# Patient Record
Sex: Female | Born: 1935 | Race: White | Hispanic: No | Marital: Married
Health system: Southern US, Community
[De-identification: ages and names within clinical notes are randomized; demographics above are authoritative.]

---

## 1998-02-26 ENCOUNTER — Other Ambulatory Visit: Admission: RE | Admit: 1998-02-26 | Discharge: 1998-02-26 | Payer: Self-pay | Admitting: Nephrology

## 1998-03-25 ENCOUNTER — Other Ambulatory Visit: Admission: RE | Admit: 1998-03-25 | Discharge: 1998-03-25 | Payer: Self-pay | Admitting: Nephrology

## 1998-03-30 ENCOUNTER — Other Ambulatory Visit: Admission: RE | Admit: 1998-03-30 | Discharge: 1998-03-30 | Payer: Self-pay | Admitting: *Deleted

## 1998-06-10 ENCOUNTER — Encounter: Payer: Self-pay | Admitting: Emergency Medicine

## 1998-06-10 ENCOUNTER — Encounter: Payer: Self-pay | Admitting: Nephrology

## 1998-06-10 ENCOUNTER — Inpatient Hospital Stay (HOSPITAL_COMMUNITY): Admission: EM | Admit: 1998-06-10 | Discharge: 1998-06-17 | Payer: Self-pay | Admitting: *Deleted

## 1998-06-15 ENCOUNTER — Encounter: Payer: Self-pay | Admitting: Nephrology

## 1998-07-03 ENCOUNTER — Ambulatory Visit (HOSPITAL_COMMUNITY): Admission: RE | Admit: 1998-07-03 | Discharge: 1998-07-03 | Payer: Self-pay | Admitting: Cardiology

## 1998-07-03 ENCOUNTER — Encounter: Payer: Self-pay | Admitting: Cardiology

## 2000-04-04 ENCOUNTER — Encounter (HOSPITAL_COMMUNITY): Admission: RE | Admit: 2000-04-04 | Discharge: 2000-07-03 | Payer: Self-pay | Admitting: Nephrology

## 2000-10-18 ENCOUNTER — Encounter: Payer: Self-pay | Admitting: Nephrology

## 2000-10-18 ENCOUNTER — Ambulatory Visit (HOSPITAL_COMMUNITY): Admission: RE | Admit: 2000-10-18 | Discharge: 2000-10-18 | Payer: Self-pay | Admitting: Nephrology

## 2000-10-25 ENCOUNTER — Encounter (HOSPITAL_COMMUNITY): Admission: RE | Admit: 2000-10-25 | Discharge: 2001-01-23 | Payer: Self-pay | Admitting: Nephrology

## 2001-01-24 ENCOUNTER — Encounter (HOSPITAL_COMMUNITY): Admission: RE | Admit: 2001-01-24 | Discharge: 2001-04-24 | Payer: Self-pay | Admitting: Nephrology

## 2001-05-02 ENCOUNTER — Encounter (HOSPITAL_COMMUNITY): Admission: RE | Admit: 2001-05-02 | Discharge: 2001-07-31 | Payer: Self-pay | Admitting: Nephrology

## 2001-08-08 ENCOUNTER — Encounter (HOSPITAL_COMMUNITY): Admission: RE | Admit: 2001-08-08 | Discharge: 2001-11-06 | Payer: Self-pay | Admitting: Nephrology

## 2001-11-14 ENCOUNTER — Encounter (HOSPITAL_COMMUNITY): Admission: RE | Admit: 2001-11-14 | Discharge: 2002-02-12 | Payer: Self-pay | Admitting: Nephrology

## 2002-05-15 ENCOUNTER — Encounter (HOSPITAL_COMMUNITY): Admission: RE | Admit: 2002-05-15 | Discharge: 2002-08-13 | Payer: Self-pay | Admitting: Nephrology

## 2002-08-20 ENCOUNTER — Encounter (HOSPITAL_COMMUNITY): Admission: RE | Admit: 2002-08-20 | Discharge: 2002-11-18 | Payer: Self-pay | Admitting: Nephrology

## 2002-12-11 ENCOUNTER — Encounter (HOSPITAL_COMMUNITY): Admission: RE | Admit: 2002-12-11 | Discharge: 2003-03-11 | Payer: Self-pay | Admitting: Nephrology

## 2003-04-02 ENCOUNTER — Encounter (HOSPITAL_COMMUNITY): Admission: RE | Admit: 2003-04-02 | Discharge: 2003-07-01 | Payer: Self-pay | Admitting: Nephrology

## 2003-07-16 ENCOUNTER — Encounter (HOSPITAL_COMMUNITY): Admission: RE | Admit: 2003-07-16 | Discharge: 2003-10-14 | Payer: Self-pay | Admitting: Nephrology

## 2003-10-15 ENCOUNTER — Encounter (HOSPITAL_COMMUNITY): Admission: RE | Admit: 2003-10-15 | Discharge: 2004-01-13 | Payer: Self-pay | Admitting: Nephrology

## 2004-01-31 ENCOUNTER — Emergency Department (HOSPITAL_COMMUNITY): Admission: EM | Admit: 2004-01-31 | Discharge: 2004-01-31 | Payer: Self-pay | Admitting: Emergency Medicine

## 2004-02-03 ENCOUNTER — Encounter (HOSPITAL_COMMUNITY): Admission: RE | Admit: 2004-02-03 | Discharge: 2004-05-03 | Payer: Self-pay | Admitting: Nephrology

## 2004-05-26 ENCOUNTER — Encounter (HOSPITAL_COMMUNITY): Admission: RE | Admit: 2004-05-26 | Discharge: 2004-08-24 | Payer: Self-pay | Admitting: Nephrology

## 2004-08-25 ENCOUNTER — Encounter (HOSPITAL_COMMUNITY): Admission: RE | Admit: 2004-08-25 | Discharge: 2004-11-23 | Payer: Self-pay | Admitting: Nephrology

## 2004-11-24 ENCOUNTER — Encounter (HOSPITAL_COMMUNITY): Admission: RE | Admit: 2004-11-24 | Discharge: 2005-02-22 | Payer: Self-pay | Admitting: Nephrology

## 2005-03-17 ENCOUNTER — Encounter (HOSPITAL_COMMUNITY): Admission: RE | Admit: 2005-03-17 | Discharge: 2005-06-15 | Payer: Self-pay | Admitting: Nephrology

## 2005-07-06 ENCOUNTER — Encounter (HOSPITAL_COMMUNITY): Admission: RE | Admit: 2005-07-06 | Discharge: 2005-10-04 | Payer: Self-pay | Admitting: Nephrology

## 2005-10-26 ENCOUNTER — Encounter (HOSPITAL_COMMUNITY): Admission: RE | Admit: 2005-10-26 | Discharge: 2006-01-24 | Payer: Self-pay | Admitting: Nephrology

## 2006-01-25 ENCOUNTER — Encounter (HOSPITAL_COMMUNITY): Admission: RE | Admit: 2006-01-25 | Discharge: 2006-04-25 | Payer: Self-pay | Admitting: Critical Care Medicine

## 2006-07-03 ENCOUNTER — Ambulatory Visit: Payer: Self-pay

## 2006-07-05 ENCOUNTER — Encounter: Admission: RE | Admit: 2006-07-05 | Discharge: 2006-07-05 | Payer: Self-pay | Admitting: Nephrology

## 2006-10-11 ENCOUNTER — Encounter (HOSPITAL_COMMUNITY): Admission: RE | Admit: 2006-10-11 | Discharge: 2007-01-09 | Payer: Self-pay | Admitting: Nephrology

## 2007-01-29 ENCOUNTER — Encounter (HOSPITAL_COMMUNITY): Admission: RE | Admit: 2007-01-29 | Discharge: 2007-04-29 | Payer: Self-pay | Admitting: Nephrology

## 2007-05-23 ENCOUNTER — Encounter (HOSPITAL_COMMUNITY): Admission: RE | Admit: 2007-05-23 | Discharge: 2007-08-21 | Payer: Self-pay | Admitting: Nephrology

## 2007-07-03 ENCOUNTER — Ambulatory Visit: Payer: Self-pay

## 2007-08-22 ENCOUNTER — Encounter (HOSPITAL_COMMUNITY): Admission: RE | Admit: 2007-08-22 | Discharge: 2007-11-20 | Payer: Self-pay | Admitting: Nephrology

## 2007-12-17 ENCOUNTER — Encounter (HOSPITAL_COMMUNITY): Admission: RE | Admit: 2007-12-17 | Discharge: 2008-03-16 | Payer: Self-pay | Admitting: Nephrology

## 2008-03-19 ENCOUNTER — Encounter (HOSPITAL_COMMUNITY): Admission: RE | Admit: 2008-03-19 | Discharge: 2008-06-17 | Payer: Self-pay | Admitting: Nephrology

## 2008-07-16 ENCOUNTER — Encounter (HOSPITAL_COMMUNITY): Admission: RE | Admit: 2008-07-16 | Discharge: 2008-10-09 | Payer: Self-pay | Admitting: Nephrology

## 2008-10-28 ENCOUNTER — Ambulatory Visit: Payer: Self-pay | Admitting: Vascular Surgery

## 2008-11-05 ENCOUNTER — Encounter (HOSPITAL_COMMUNITY): Admission: RE | Admit: 2008-11-05 | Discharge: 2009-01-28 | Payer: Self-pay | Admitting: Nephrology

## 2009-03-04 ENCOUNTER — Encounter (HOSPITAL_COMMUNITY): Admission: RE | Admit: 2009-03-04 | Discharge: 2009-06-02 | Payer: Self-pay | Admitting: Nephrology

## 2009-06-24 ENCOUNTER — Encounter (HOSPITAL_COMMUNITY): Admission: RE | Admit: 2009-06-24 | Discharge: 2009-09-22 | Payer: Self-pay | Admitting: Nephrology

## 2009-09-23 ENCOUNTER — Encounter (HOSPITAL_COMMUNITY): Admission: RE | Admit: 2009-09-23 | Discharge: 2009-12-22 | Payer: Self-pay | Admitting: Nephrology

## 2010-01-06 ENCOUNTER — Encounter (HOSPITAL_COMMUNITY): Admission: RE | Admit: 2010-01-06 | Discharge: 2010-04-06 | Payer: Self-pay | Admitting: Nephrology

## 2010-04-15 ENCOUNTER — Encounter (HOSPITAL_COMMUNITY): Admission: RE | Admit: 2010-04-15 | Discharge: 2010-07-14 | Payer: Self-pay | Admitting: Nephrology

## 2010-04-28 ENCOUNTER — Observation Stay (HOSPITAL_COMMUNITY): Admission: RE | Admit: 2010-04-28 | Discharge: 2010-04-29 | Payer: Self-pay | Admitting: Cardiology

## 2010-05-07 ENCOUNTER — Encounter: Admission: RE | Admit: 2010-05-07 | Discharge: 2010-05-07 | Payer: Self-pay | Admitting: Cardiology

## 2010-06-02 ENCOUNTER — Emergency Department (HOSPITAL_COMMUNITY): Admission: EM | Admit: 2010-06-02 | Discharge: 2010-06-02 | Payer: Self-pay | Admitting: Emergency Medicine

## 2010-06-09 ENCOUNTER — Ambulatory Visit: Payer: Self-pay | Admitting: Cardiology

## 2010-06-27 ENCOUNTER — Inpatient Hospital Stay (HOSPITAL_COMMUNITY): Admission: EM | Admit: 2010-06-27 | Discharge: 2010-07-02 | Payer: Self-pay | Admitting: Emergency Medicine

## 2010-06-27 ENCOUNTER — Ambulatory Visit: Payer: Self-pay | Admitting: Cardiovascular Disease

## 2010-06-28 ENCOUNTER — Ambulatory Visit: Payer: Self-pay | Admitting: Surgery

## 2010-06-28 ENCOUNTER — Encounter: Payer: Self-pay | Admitting: Cardiovascular Disease

## 2010-07-14 ENCOUNTER — Encounter (HOSPITAL_COMMUNITY)
Admission: RE | Admit: 2010-07-14 | Discharge: 2010-10-12 | Payer: Self-pay | Source: Home / Self Care | Attending: Nephrology | Admitting: Nephrology

## 2010-07-26 ENCOUNTER — Ambulatory Visit: Payer: Self-pay | Admitting: Cardiology

## 2010-08-04 ENCOUNTER — Ambulatory Visit: Payer: Self-pay | Admitting: Cardiology

## 2010-08-07 ENCOUNTER — Encounter: Payer: Self-pay | Admitting: Internal Medicine

## 2010-08-20 ENCOUNTER — Ambulatory Visit: Payer: Self-pay | Admitting: Cardiology

## 2010-08-24 ENCOUNTER — Encounter (INDEPENDENT_AMBULATORY_CARE_PROVIDER_SITE_OTHER): Payer: Self-pay | Admitting: *Deleted

## 2010-09-01 ENCOUNTER — Ambulatory Visit: Payer: Self-pay | Admitting: Cardiovascular Disease

## 2010-09-01 ENCOUNTER — Inpatient Hospital Stay (HOSPITAL_COMMUNITY)
Admission: EM | Admit: 2010-09-01 | Discharge: 2010-09-13 | Disposition: A | Discharge status: Hospice | Payer: Self-pay | Source: Home / Self Care | Attending: Internal Medicine | Admitting: Internal Medicine

## 2010-09-07 ENCOUNTER — Ambulatory Visit: Payer: Self-pay | Admitting: Internal Medicine

## 2010-09-21 ENCOUNTER — Ambulatory Visit: Payer: Self-pay | Admitting: Internal Medicine

## 2010-09-22 ENCOUNTER — Encounter: Payer: Self-pay | Admitting: Internal Medicine

## 2010-10-10 DEATH — deceased

## 2010-10-14 IMAGING — CR DG CHEST 2V
2 series · 2 of 2 positions shown · non-contrast
Comparison: 1 day prior

CLINICAL DATA: Status post pacer placement.  Chest pain.

CHEST - 2 VIEW

[w chest pa]
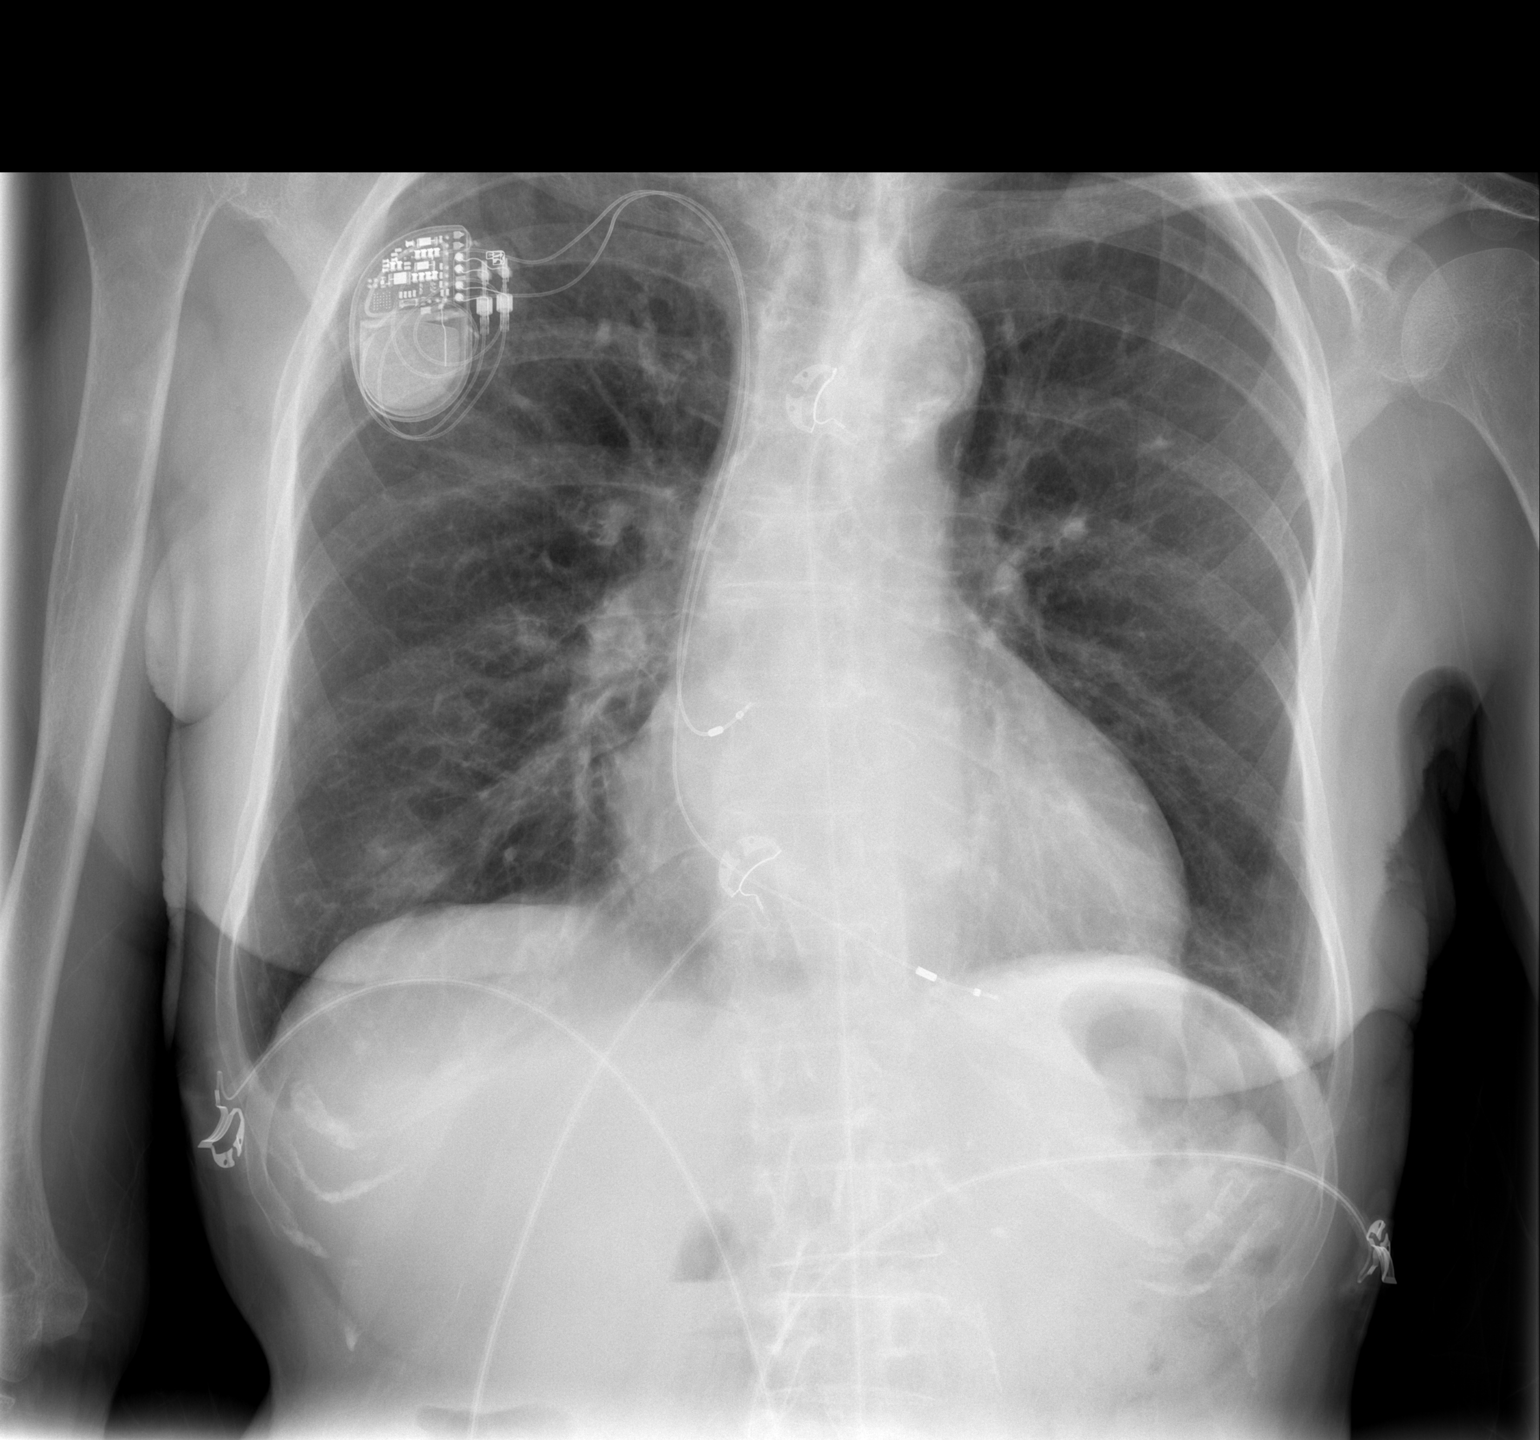

[w chest lat]
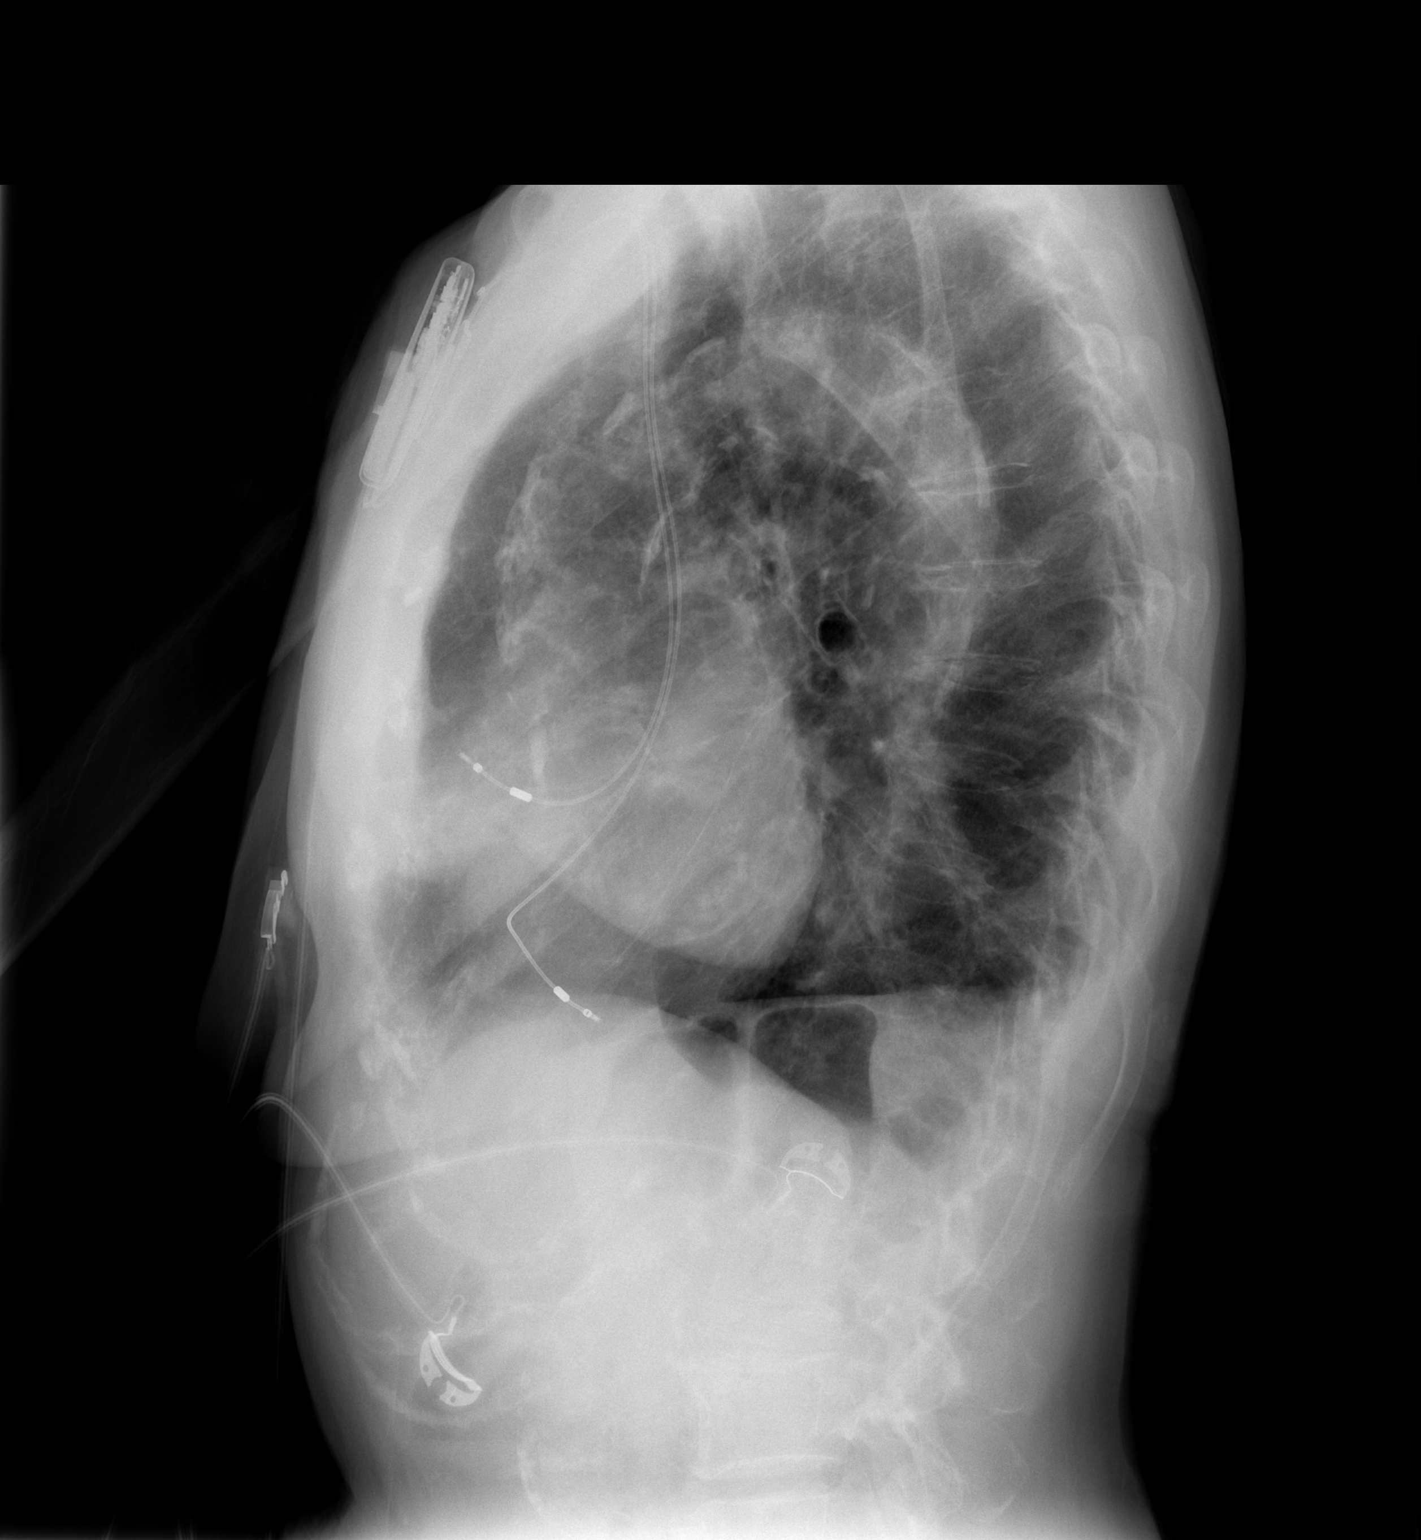

[2 of 2 positions shown; findings below may reference images not displayed]

FINDINGS: Hyperinflation suggests COPD.

Mild osteopenia.  Interval placement of a dual lead pacer.  Leads
terminate at the right atrium and right ventricle.

Midline trachea.  Normal heart size and mediastinal contours for
age.  No pleural effusion or pneumothorax.  Vague increased density
projecting over the right lower lobe on the frontal view. No
congestive failure.
IMPRESSION: 1.  Status post pacer placement without pneumothorax.
2.  COPD without acute superimposed process.
3.  Vague increased density projecting over the right lung base on
the frontal film.  Favored to represent a summation shadow,
possibly with overlying scarring.  Not readily apparent on
yesterday's plain film.  Consider radiographic follow-up.  If there
are infectious symptoms, early infection cannot be excluded.

## 2010-11-09 NOTE — Miscellaneous (Signed)
Summary: Device preload  Clinical Lists Changes  Observations: Added new observation of PPM INDICATN: A-fib Sick sinus syndrome (08/07/2010 11:48) Added new observation of MAGNET RTE: BOL 85 ERI 65 (08/07/2010 11:48) Added new observation of PPMLEADSTAT2: active (08/07/2010 11:48) Added new observation of PPMLEADSER2: 045409  (08/07/2010 11:48) Added new observation of PPMLEADMOD2: 4470  (08/07/2010 11:48) Added new observation of PPMLEADDOI2: 04/29/2010  (08/07/2010 11:48) Added new observation of PPMLEADLOC2: RV  (08/07/2010 11:48) Added new observation of PPMLEADSTAT1: active  (08/07/2010 11:48) Added new observation of PPMLEADSER1: 811914  (08/07/2010 11:48) Added new observation of PPMLEADMOD1: 4469  (08/07/2010 11:48) Added new observation of PPMLEADDOI1: 04/29/2010  (08/07/2010 11:48) Added new observation of PPMLEADLOC1: RA  (08/07/2010 11:48) Added new observation of PPM DOI: 04/29/2010  (08/07/2010 11:48) Added new observation of PPM SERL#: [id number]  (08/07/2010 11:48) Added new observation of PPM MODL#: [id number]  (08/07/2010 08:65) Added new observation of PACEMAKERMFG: [employer]  (08/07/2010 11:48) Added new observation of PPM IMP MD: Duffy Rhody Tennant,MD  (08/07/2010 11:48) Added new observation of PPM REFER MD: Rolla Plate  (08/07/2010 11:48) Added new observation of PACEMAKER MD: Hillis Range, MD  (08/07/2010 11:48)      PPM Specifications Following MD:  Hillis Range, MD     Referring MD:  Rolla Plate PPM Vendor:  [employer]tronic     PPM Model Number:  [id number]     PPM Serial Number:  [id number] PPM DOI:  04/29/2010     PPM Implanting MD:  Rolla Plate  Lead 1    Location: RA     DOI: 04/29/2010     Model #: [id number]     Serial #: [id number]     Status: active Lead 2    Location: RV     DOI: 04/29/2010     Model #: [id number]     Serial #: [id number]     Status: active  Magnet Response Rate:  BOL 85 ERI 65  Indications:  A-fib Sick sinus syndrome

## 2010-11-09 NOTE — Letter (Signed)
Summary: New Patient letter  [facility]  [address] [place], [place] [address]   Phone: [phone]  Fax: [phone]       08/24/2010 MRN: [id number]  Judith Leach [address], [place]  [address]  Dear Ms. Judith Leach,  Welcome to the Gastroenterology Division at [facility].    You are scheduled to see Dr. Leone Payor on 10-08-10 at 2:00p.m. on the 3rd floor at [facility], [address].  We ask that you try to arrive at our office 15 minutes prior to your appointment time to allow for check-in.  We would like you to complete the enclosed self-administered evaluation form prior to your visit and bring it with you on the day of your appointment.  We will review it with you.  Also, please bring a complete list of all your medications or, if you prefer, bring the medication bottles and we will list them.  Please bring your insurance card so that we may make a copy of it.  If your insurance requires a referral to see a specialist, please bring your referral form from your primary care physician.  Co-payments are due at the time of your visit and may be paid by cash, check or credit card.     Your office visit will consist of a consult with your physician (includes a physical exam), any laboratory testing he/she may order, scheduling of any necessary diagnostic testing (e.g. x-ray, ultrasound, CT-scan), and scheduling of a procedure (e.g. Endoscopy, Colonoscopy) if required.  Please allow enough time on your schedule to allow for any/all of these possibilities.    If you cannot keep your appointment, please call [phone] to cancel or reschedule prior to your appointment date.  This allows [place] the opportunity to schedule an appointment for another patient in need of care.  If you do not cancel or reschedule by 5 p.m. the business day prior to your appointment date, you will be charged a $50.00 late cancellation/no-show fee.    Thank you for choosing [facility] for your medical needs.  We appreciate the opportunity to care for you.  Please visit [place] at our website  to learn more about our practice.                     Sincerely,                                                             The Gastroenterology Division

## 2010-11-11 NOTE — Letter (Signed)
Summary: Appointment -missed  [facility] [address]   [place], [place] [address]   Phone: [phone]  Fax: [phone]     September 21, 2010 MRN: [id number]     Judith Leach [address] [facility] [address], [place]  [address]     Dear Judith Leach,  Our records indicate you missed your appointment on September 21, 2010                        with Dr.  Johney Frame.   It is very important that we reach you to reschedule this appointment. We look forward to participating in your health care needs.   Please contact us at the number listed above at your earliest convenience to reschedule this appointment.   Sincerely,    Glass blower/designer

## 2010-12-21 LAB — CBC
HCT: 37.4 % (ref 36.0–46.0)
HCT: 38.9 % (ref 36.0–46.0)
HCT: 38.2 % (ref 36.0–46.0)
HCT: 38.7 % (ref 36.0–46.0)
HCT: 39.8 % (ref 36.0–46.0)
HCT: 39.8 % (ref 36.0–46.0)
HCT: 40.1 % (ref 36.0–46.0)
HCT: 40.3 % (ref 36.0–46.0)
HCT: 41.8 % (ref 36.0–46.0)
HCT: 43.3 % (ref 36.0–46.0)
Hemoglobin: 12.2 g/dL (ref 12.0–15.0)
Hemoglobin: 12.5 g/dL (ref 12.0–15.0)
Hemoglobin: 13.1 g/dL (ref 12.0–15.0)
Hemoglobin: 13.3 g/dL (ref 12.0–15.0)
Hemoglobin: 13.3 g/dL (ref 12.0–15.0)
Hemoglobin: 13.8 g/dL (ref 12.0–15.0)
MCH: 31.9 pg (ref 26.0–34.0)
MCH: 32.1 pg (ref 26.0–34.0)
MCH: 32.8 pg (ref 26.0–34.0)
MCHC: 32.9 g/dL (ref 30.0–36.0)
MCHC: 32.3 g/dL (ref 30.0–36.0)
MCHC: 32.8 g/dL (ref 30.0–36.0)
MCHC: 33 g/dL (ref 30.0–36.0)
MCV: 97 fL (ref 78.0–100.0)
MCV: 98.7 fL (ref 78.0–100.0)
MCV: 99.5 fL (ref 78.0–100.0)
MCV: 99.5 fL (ref 78.0–100.0)
MCV: 97.9 fL (ref 78.0–100.0)
MCV: 98.7 fL (ref 78.0–100.0)
MCV: 99.4 fL (ref 78.0–100.0)
MCV: 99.7 fL (ref 78.0–100.0)
MCV: 99.8 fL (ref 78.0–100.0)
MCV: 99.8 fL (ref 78.0–100.0)
MCV: 99.8 fL (ref 78.0–100.0)
MCV: 99.9 fL (ref 78.0–100.0)
Platelets: 105 10*3/uL — ABNORMAL LOW (ref 150–400)
Platelets: 108 10*3/uL — ABNORMAL LOW (ref 150–400)
Platelets: 162 10*3/uL (ref 150–400)
Platelets: 139 10*3/uL — ABNORMAL LOW (ref 150–400)
Platelets: 213 10*3/uL (ref 150–400)
RBC: 3.67 MIL/uL — ABNORMAL LOW (ref 3.87–5.11)
RBC: 3.79 MIL/uL — ABNORMAL LOW (ref 3.87–5.11)
RBC: 3.9 MIL/uL (ref 3.87–5.11)
RBC: 4.01 MIL/uL (ref 3.87–5.11)
RBC: 4.04 MIL/uL (ref 3.87–5.11)
RBC: 4.1 MIL/uL (ref 3.87–5.11)
RBC: 4.34 MIL/uL (ref 3.87–5.11)
RDW: 17.5 % — ABNORMAL HIGH (ref 11.5–15.5)
RDW: 17.7 % — ABNORMAL HIGH (ref 11.5–15.5)
RDW: 17.8 % — ABNORMAL HIGH (ref 11.5–15.5)
RDW: 17.4 % — ABNORMAL HIGH (ref 11.5–15.5)
RDW: 17.5 % — ABNORMAL HIGH (ref 11.5–15.5)
RDW: 17.8 % — ABNORMAL HIGH (ref 11.5–15.5)
RDW: 17.9 % — ABNORMAL HIGH (ref 11.5–15.5)
RDW: 18 % — ABNORMAL HIGH (ref 11.5–15.5)
WBC: 14.3 10*3/uL — ABNORMAL HIGH (ref 4.0–10.5)
WBC: 15.6 10*3/uL — ABNORMAL HIGH (ref 4.0–10.5)
WBC: 16.6 10*3/uL — ABNORMAL HIGH (ref 4.0–10.5)
WBC: 21.4 10*3/uL — ABNORMAL HIGH (ref 4.0–10.5)
WBC: 10.2 10*3/uL (ref 4.0–10.5)
WBC: 11 10*3/uL — ABNORMAL HIGH (ref 4.0–10.5)
WBC: 12 10*3/uL — ABNORMAL HIGH (ref 4.0–10.5)
WBC: 18.6 10*3/uL — ABNORMAL HIGH (ref 4.0–10.5)
WBC: 20.3 10*3/uL — ABNORMAL HIGH (ref 4.0–10.5)
WBC: 21.1 10*3/uL — ABNORMAL HIGH (ref 4.0–10.5)
WBC: 9 10*3/uL (ref 4.0–10.5)

## 2010-12-21 LAB — COMPREHENSIVE METABOLIC PANEL
ALT: 16 U/L (ref 0–35)
ALT: 17 U/L (ref 0–35)
AST: 29 U/L (ref 0–37)
AST: 19 U/L (ref 0–37)
Albumin: 1.8 g/dL — ABNORMAL LOW (ref 3.5–5.2)
Albumin: 2 g/dL — ABNORMAL LOW (ref 3.5–5.2)
Albumin: 2.1 g/dL — ABNORMAL LOW (ref 3.5–5.2)
Albumin: 2.1 g/dL — ABNORMAL LOW (ref 3.5–5.2)
Alkaline Phosphatase: 53 U/L (ref 39–117)
Alkaline Phosphatase: 59 U/L (ref 39–117)
Alkaline Phosphatase: 50 U/L (ref 39–117)
BUN: 87 mg/dL — ABNORMAL HIGH (ref 6–23)
BUN: 90 mg/dL — ABNORMAL HIGH (ref 6–23)
BUN: 91 mg/dL — ABNORMAL HIGH (ref 6–23)
BUN: 93 mg/dL — ABNORMAL HIGH (ref 6–23)
BUN: 95 mg/dL — ABNORMAL HIGH (ref 6–23)
BUN: 83 mg/dL — ABNORMAL HIGH (ref 6–23)
BUN: 93 mg/dL — ABNORMAL HIGH (ref 6–23)
CO2: 21 mEq/L (ref 19–32)
CO2: 25 mEq/L (ref 19–32)
CO2: 18 mEq/L — ABNORMAL LOW (ref 19–32)
Calcium: 7.8 mg/dL — ABNORMAL LOW (ref 8.4–10.5)
Calcium: 8 mg/dL — ABNORMAL LOW (ref 8.4–10.5)
Calcium: 8.7 mg/dL (ref 8.4–10.5)
Chloride: 112 mEq/L (ref 96–112)
Chloride: 113 mEq/L — ABNORMAL HIGH (ref 96–112)
Chloride: 116 mEq/L — ABNORMAL HIGH (ref 96–112)
Creatinine, Ser: 2.79 mg/dL — ABNORMAL HIGH (ref 0.4–1.2)
Creatinine, Ser: 2.79 mg/dL — ABNORMAL HIGH (ref 0.4–1.2)
Creatinine, Ser: 2.81 mg/dL — ABNORMAL HIGH (ref 0.4–1.2)
Creatinine, Ser: 2.8 mg/dL — ABNORMAL HIGH (ref 0.4–1.2)
GFR calc Af Amer: 19 mL/min — ABNORMAL LOW (ref >60)
GFR calc Af Amer: 17 mL/min — ABNORMAL LOW (ref >60)
GFR calc Af Amer: 19 mL/min — ABNORMAL LOW (ref >60)
GFR calc non Af Amer: 16 mL/min — ABNORMAL LOW (ref >60)
GFR calc non Af Amer: 16 mL/min — ABNORMAL LOW (ref >60)
Glucose, Bld: 117 mg/dL — ABNORMAL HIGH (ref 70–99)
Glucose, Bld: 144 mg/dL — ABNORMAL HIGH (ref 70–99)
Glucose, Bld: 152 mg/dL — ABNORMAL HIGH (ref 70–99)
Glucose, Bld: 136 mg/dL — ABNORMAL HIGH (ref 70–99)
Potassium: 3.1 mEq/L — ABNORMAL LOW (ref 3.5–5.1)
Potassium: 3.3 mEq/L — ABNORMAL LOW (ref 3.5–5.1)
Potassium: 4.6 mEq/L (ref 3.5–5.1)
Sodium: 148 mEq/L — ABNORMAL HIGH (ref 135–145)
Sodium: 150 mEq/L — ABNORMAL HIGH (ref 135–145)
Sodium: 150 mEq/L — ABNORMAL HIGH (ref 135–145)
Total Bilirubin: 0.4 mg/dL (ref 0.3–1.2)
Total Bilirubin: 0.5 mg/dL (ref 0.3–1.2)
Total Bilirubin: 0.9 mg/dL (ref 0.3–1.2)
Total Protein: 3.9 g/dL — ABNORMAL LOW (ref 6.0–8.3)
Total Protein: 4.2 g/dL — ABNORMAL LOW (ref 6.0–8.3)
Total Protein: 4.3 g/dL — ABNORMAL LOW (ref 6.0–8.3)
Total Protein: 4.4 g/dL — ABNORMAL LOW (ref 6.0–8.3)

## 2010-12-21 LAB — GLUCOSE, CAPILLARY
Glucose-Capillary: 117 mg/dL — ABNORMAL HIGH (ref 70–99)
Glucose-Capillary: 129 mg/dL — ABNORMAL HIGH (ref 70–99)
Glucose-Capillary: 129 mg/dL — ABNORMAL HIGH (ref 70–99)
Glucose-Capillary: 134 mg/dL — ABNORMAL HIGH (ref 70–99)
Glucose-Capillary: 141 mg/dL — ABNORMAL HIGH (ref 70–99)
Glucose-Capillary: 147 mg/dL — ABNORMAL HIGH (ref 70–99)
Glucose-Capillary: 156 mg/dL — ABNORMAL HIGH (ref 70–99)

## 2010-12-21 LAB — BLOOD GAS, ARTERIAL
Bicarbonate: 13.4 mEq/L — ABNORMAL LOW (ref 20.0–24.0)
Bicarbonate: 14.5 mEq/L — ABNORMAL LOW (ref 20.0–24.0)
Drawn by: 317871
O2 Content: 2 L/min
O2 Saturation: 95 %
O2 Saturation: 95 %
O2 Saturation: 97.5 %
Patient temperature: 98.6
Patient temperature: 98.6
TCO2: 12.3 mmol/L (ref 0–100)
pCO2 arterial: 25.2 mmHg — ABNORMAL LOW (ref 35.0–45.0)
pH, Arterial: 7.355 (ref 7.350–7.400)
pH, Arterial: 7.379 (ref 7.350–7.400)
pO2, Arterial: 99.9 mmHg (ref 80.0–100.0)

## 2010-12-21 LAB — PROTIME-INR
INR: 1.45 (ref 0.00–1.49)
INR: 1.46 (ref 0.00–1.49)
INR: 1.37 (ref 0.00–1.49)
Prothrombin Time: 17.8 seconds — ABNORMAL HIGH (ref 11.6–15.2)
Prothrombin Time: 17.9 seconds — ABNORMAL HIGH (ref 11.6–15.2)
Prothrombin Time: 18.4 seconds — ABNORMAL HIGH (ref 11.6–15.2)
Prothrombin Time: 17.1 seconds — ABNORMAL HIGH (ref 11.6–15.2)

## 2010-12-21 LAB — BASIC METABOLIC PANEL
BUN: 104 mg/dL — ABNORMAL HIGH (ref 6–23)
BUN: 80 mg/dL — ABNORMAL HIGH (ref 6–23)
BUN: 90 mg/dL — ABNORMAL HIGH (ref 6–23)
BUN: 98 mg/dL — ABNORMAL HIGH (ref 6–23)
CO2: 19 mEq/L (ref 19–32)
CO2: 23 mEq/L (ref 19–32)
Calcium: 8.8 mg/dL (ref 8.4–10.5)
Chloride: 101 mEq/L (ref 96–112)
Chloride: 106 mEq/L (ref 96–112)
Chloride: 109 mEq/L (ref 96–112)
Chloride: 111 mEq/L (ref 96–112)
Chloride: 113 mEq/L — ABNORMAL HIGH (ref 96–112)
Chloride: 119 mEq/L — ABNORMAL HIGH (ref 96–112)
Chloride: 121 mEq/L — ABNORMAL HIGH (ref 96–112)
Chloride: 125 mEq/L — ABNORMAL HIGH (ref 96–112)
Creatinine, Ser: 2.77 mg/dL — ABNORMAL HIGH (ref 0.4–1.2)
GFR calc Af Amer: 17 mL/min — ABNORMAL LOW (ref >60)
GFR calc Af Amer: 19 mL/min — ABNORMAL LOW (ref >60)
GFR calc Af Amer: 20 mL/min — ABNORMAL LOW (ref >60)
GFR calc Af Amer: 21 mL/min — ABNORMAL LOW (ref >60)
GFR calc non Af Amer: 12 mL/min — ABNORMAL LOW (ref >60)
GFR calc non Af Amer: 13 mL/min — ABNORMAL LOW (ref >60)
GFR calc non Af Amer: 14 mL/min — ABNORMAL LOW (ref >60)
GFR calc non Af Amer: 14 mL/min — ABNORMAL LOW (ref >60)
GFR calc non Af Amer: 16 mL/min — ABNORMAL LOW (ref >60)
GFR calc non Af Amer: 16 mL/min — ABNORMAL LOW (ref >60)
GFR calc non Af Amer: 17 mL/min — ABNORMAL LOW (ref >60)
GFR calc non Af Amer: 18 mL/min — ABNORMAL LOW (ref >60)
Glucose, Bld: 128 mg/dL — ABNORMAL HIGH (ref 70–99)
Glucose, Bld: 130 mg/dL — ABNORMAL HIGH (ref 70–99)
Glucose, Bld: 153 mg/dL — ABNORMAL HIGH (ref 70–99)
Potassium: 3.5 mEq/L (ref 3.5–5.1)
Potassium: 4.1 mEq/L (ref 3.5–5.1)
Potassium: 4.4 mEq/L (ref 3.5–5.1)
Potassium: 4.5 mEq/L (ref 3.5–5.1)
Potassium: 4.6 mEq/L (ref 3.5–5.1)
Potassium: 4.7 mEq/L (ref 3.5–5.1)
Potassium: 4.8 mEq/L (ref 3.5–5.1)
Potassium: 4.9 mEq/L (ref 3.5–5.1)
Sodium: 138 mEq/L (ref 135–145)
Sodium: 142 mEq/L (ref 135–145)
Sodium: 142 mEq/L (ref 135–145)
Sodium: 145 mEq/L (ref 135–145)
Sodium: 148 mEq/L — ABNORMAL HIGH (ref 135–145)
Sodium: 149 mEq/L — ABNORMAL HIGH (ref 135–145)
Sodium: 152 mEq/L — ABNORMAL HIGH (ref 135–145)

## 2010-12-21 LAB — CULTURE, BLOOD (ROUTINE X 2)
Culture  Setup Time: 201111300150
Culture: NO GROWTH

## 2010-12-21 LAB — URINALYSIS, ROUTINE W REFLEX MICROSCOPIC
Bilirubin Urine: NEGATIVE
Bilirubin Urine: NEGATIVE
Glucose, UA: NEGATIVE mg/dL
Glucose, UA: NEGATIVE mg/dL
Hgb urine dipstick: NEGATIVE
Ketones, ur: NEGATIVE mg/dL
Ketones, ur: NEGATIVE mg/dL
Protein, ur: 100 mg/dL — AB
Protein, ur: >300 mg/dL — AB
Urobilinogen, UA: 0.2 mg/dL (ref 0.0–1.0)
pH: 5.5 (ref 5.0–8.0)

## 2010-12-21 LAB — PHOSPHORUS
Phosphorus: 4.6 mg/dL (ref 2.3–4.6)
Phosphorus: 4.8 mg/dL — ABNORMAL HIGH (ref 2.3–4.6)
Phosphorus: 5.4 mg/dL — ABNORMAL HIGH (ref 2.3–4.6)
Phosphorus: 5.8 mg/dL — ABNORMAL HIGH (ref 2.3–4.6)

## 2010-12-21 LAB — DIFFERENTIAL
Basophils Relative: 0 % (ref 0–1)
Basophils Relative: 0 % (ref 0–1)
Eosinophils Absolute: 0 10*3/uL (ref 0.0–0.7)
Eosinophils Relative: 0 % (ref 0–5)
Lymphocytes Relative: 10 % — ABNORMAL LOW (ref 12–46)
Lymphocytes Relative: 4 % — ABNORMAL LOW (ref 12–46)
Lymphs Abs: 1 10*3/uL (ref 0.7–4.0)
Monocytes Absolute: 0.6 10*3/uL (ref 0.1–1.0)
Monocytes Absolute: 0.3 10*3/uL (ref 0.1–1.0)
Monocytes Relative: 3 % (ref 3–12)
Monocytes Relative: 4 % (ref 3–12)
Neutro Abs: 19.8 10*3/uL — ABNORMAL HIGH (ref 1.7–7.7)
Neutro Abs: 18.9 10*3/uL — ABNORMAL HIGH (ref 1.7–7.7)
Neutro Abs: 8.1 10*3/uL — ABNORMAL HIGH (ref 1.7–7.7)
Neutrophils Relative %: 93 % — ABNORMAL HIGH (ref 43–77)

## 2010-12-21 LAB — CULTURE, ROUTINE-ABSCESS

## 2010-12-21 LAB — TYPE AND SCREEN
ABO/RH(D): A POS
Antibody Screen: POSITIVE
Unit division: 0

## 2010-12-21 LAB — PREALBUMIN: Prealbumin: 9.8 mg/dL — ABNORMAL LOW (ref 18.0–45.0)

## 2010-12-21 LAB — URINE CULTURE
Colony Count: NO GROWTH
Culture  Setup Time: 201112010339
Special Requests: NEGATIVE

## 2010-12-21 LAB — RENAL FUNCTION PANEL
BUN: 89 mg/dL — ABNORMAL HIGH (ref 6–23)
CO2: 16 mEq/L — ABNORMAL LOW (ref 19–32)
Glucose, Bld: 130 mg/dL — ABNORMAL HIGH (ref 70–99)
Phosphorus: 6.2 mg/dL — ABNORMAL HIGH (ref 2.3–4.6)
Potassium: 4.1 mEq/L (ref 3.5–5.1)

## 2010-12-21 LAB — CARDIAC PANEL(CRET KIN+CKTOT+MB+TROPI)
CK, MB: 3.8 ng/mL (ref 0.3–4.0)
CK, MB: 4 ng/mL (ref 0.3–4.0)
CK, MB: 5.3 ng/mL — ABNORMAL HIGH (ref 0.3–4.0)
Relative Index: INVALID (ref 0.0–2.5)
Relative Index: INVALID (ref 0.0–2.5)
Relative Index: INVALID (ref 0.0–2.5)
Total CK: 15 U/L (ref 7–177)
Total CK: 19 U/L (ref 7–177)
Total CK: 34 U/L (ref 7–177)
Troponin I: 0.04 ng/mL (ref 0.00–0.06)
Troponin I: 0.05 ng/mL (ref 0.00–0.06)

## 2010-12-21 LAB — LACTIC ACID, PLASMA
Lactic Acid, Venous: 1.1 mmol/L (ref 0.5–2.2)
Lactic Acid, Venous: 1.2 mmol/L (ref 0.5–2.2)

## 2010-12-21 LAB — CULTURE, RESPIRATORY W GRAM STAIN: Culture: NORMAL

## 2010-12-21 LAB — ANAEROBIC CULTURE

## 2010-12-21 LAB — BRAIN NATRIURETIC PEPTIDE
Pro B Natriuretic peptide (BNP): 265 pg/mL — ABNORMAL HIGH (ref 0.0–100.0)
Pro B Natriuretic peptide (BNP): 327 pg/mL — ABNORMAL HIGH (ref 0.0–100.0)

## 2010-12-21 LAB — CARBOXYHEMOGLOBIN
Carboxyhemoglobin: 1.2 % (ref 0.5–1.5)
Methemoglobin: 1.6 % — ABNORMAL HIGH (ref 0.0–1.5)
Methemoglobin: 1.8 % — ABNORMAL HIGH (ref 0.0–1.5)

## 2010-12-21 LAB — MAGNESIUM: Magnesium: 1.2 mg/dL — ABNORMAL LOW (ref 1.5–2.5)

## 2010-12-21 LAB — PROCALCITONIN: Procalcitonin: 0.65 ng/mL

## 2010-12-21 LAB — URINE MICROSCOPIC-ADD ON

## 2010-12-22 LAB — PTH, INTACT AND CALCIUM
Calcium, Total (PTH): 9.4 mg/dL (ref 8.4–10.5)
PTH: 414.6 pg/mL — ABNORMAL HIGH (ref 14.0–72.0)

## 2010-12-22 LAB — IRON AND TIBC: Saturation Ratios: 37 % (ref 20–55)

## 2010-12-22 LAB — POCT HEMOGLOBIN-HEMACUE: Hemoglobin: 11.3 g/dL — ABNORMAL LOW (ref 12.0–15.0)

## 2010-12-23 LAB — BASIC METABOLIC PANEL
BUN: 69 mg/dL — ABNORMAL HIGH (ref 6–23)
CO2: 20 mEq/L (ref 19–32)
CO2: 23 mEq/L (ref 19–32)
CO2: 24 mEq/L (ref 19–32)
Calcium: 9.1 mg/dL (ref 8.4–10.5)
Calcium: 9.2 mg/dL (ref 8.4–10.5)
Calcium: 9.6 mg/dL (ref 8.4–10.5)
Chloride: 108 mEq/L (ref 96–112)
Chloride: 111 mEq/L (ref 96–112)
Chloride: 106 mEq/L (ref 96–112)
Creatinine, Ser: 2.67 mg/dL — ABNORMAL HIGH (ref 0.4–1.2)
Creatinine, Ser: 2.23 mg/dL — ABNORMAL HIGH (ref 0.4–1.2)
GFR calc Af Amer: 15 mL/min — ABNORMAL LOW (ref >60)
GFR calc Af Amer: 16 mL/min — ABNORMAL LOW (ref >60)
GFR calc Af Amer: 17 mL/min — ABNORMAL LOW (ref >60)
GFR calc Af Amer: 19 mL/min — ABNORMAL LOW (ref >60)
GFR calc Af Amer: 20 mL/min — ABNORMAL LOW (ref >60)
GFR calc Af Amer: 25 mL/min — ABNORMAL LOW (ref >60)
GFR calc non Af Amer: 13 mL/min — ABNORMAL LOW (ref >60)
GFR calc non Af Amer: 14 mL/min — ABNORMAL LOW (ref >60)
GFR calc non Af Amer: 15 mL/min — ABNORMAL LOW (ref >60)
GFR calc non Af Amer: 17 mL/min — ABNORMAL LOW (ref >60)
Glucose, Bld: 107 mg/dL — ABNORMAL HIGH (ref 70–99)
Potassium: 3.2 mEq/L — ABNORMAL LOW (ref 3.5–5.1)
Potassium: 3.4 mEq/L — ABNORMAL LOW (ref 3.5–5.1)
Potassium: 3.5 mEq/L (ref 3.5–5.1)
Potassium: 3.7 mEq/L (ref 3.5–5.1)
Potassium: 3.5 mEq/L (ref 3.5–5.1)
Sodium: 141 mEq/L (ref 135–145)
Sodium: 142 mEq/L (ref 135–145)
Sodium: 142 mEq/L (ref 135–145)
Sodium: 142 mEq/L (ref 135–145)
Sodium: 135 mEq/L (ref 135–145)

## 2010-12-23 LAB — CBC
HCT: 29.6 % — ABNORMAL LOW (ref 36.0–46.0)
HCT: 30.7 % — ABNORMAL LOW (ref 36.0–46.0)
HCT: 32 % — ABNORMAL LOW (ref 36.0–46.0)
HCT: 33.7 % — ABNORMAL LOW (ref 36.0–46.0)
HCT: 33.8 % — ABNORMAL LOW (ref 36.0–46.0)
Hemoglobin: 10.4 g/dL — ABNORMAL LOW (ref 12.0–15.0)
Hemoglobin: 10.6 g/dL — ABNORMAL LOW (ref 12.0–15.0)
Hemoglobin: 10.9 g/dL — ABNORMAL LOW (ref 12.0–15.0)
Hemoglobin: 11.3 g/dL — ABNORMAL LOW (ref 12.0–15.0)
Hemoglobin: 9.9 g/dL — ABNORMAL LOW (ref 12.0–15.0)
Hemoglobin: 11.5 g/dL — ABNORMAL LOW (ref 12.0–15.0)
MCHC: 33.2 g/dL (ref 30.0–36.0)
MCV: 100.7 fL — ABNORMAL HIGH (ref 78.0–100.0)
MCV: 97.7 fL (ref 78.0–100.0)
Platelets: 253 10*3/uL (ref 150–400)
Platelets: 168 10*3/uL (ref 150–400)
RBC: 2.94 MIL/uL — ABNORMAL LOW (ref 3.87–5.11)
RBC: 3.05 MIL/uL — ABNORMAL LOW (ref 3.87–5.11)
RBC: 3.23 MIL/uL — ABNORMAL LOW (ref 3.87–5.11)
RBC: 3.46 MIL/uL — ABNORMAL LOW (ref 3.87–5.11)
RDW: 19.9 % — ABNORMAL HIGH (ref 11.5–15.5)
RDW: 19.9 % — ABNORMAL HIGH (ref 11.5–15.5)
WBC: 7.8 10*3/uL (ref 4.0–10.5)
WBC: 7.9 10*3/uL (ref 4.0–10.5)
WBC: 8.9 10*3/uL (ref 4.0–10.5)
WBC: 9.8 10*3/uL (ref 4.0–10.5)
WBC: 10.8 10*3/uL — ABNORMAL HIGH (ref 4.0–10.5)

## 2010-12-23 LAB — HEMOCCULT GUIAC POC 1CARD (OFFICE): Fecal Occult Bld: NEGATIVE

## 2010-12-23 LAB — IRON AND TIBC
Iron: 27 ug/dL — ABNORMAL LOW (ref 42–135)
TIBC: 245 ug/dL — ABNORMAL LOW (ref 250–470)

## 2010-12-23 LAB — DIFFERENTIAL
Basophils Absolute: 0 10*3/uL (ref 0.0–0.1)
Basophils Relative: 0 % (ref 0–1)
Eosinophils Absolute: 0 10*3/uL (ref 0.0–0.7)
Eosinophils Relative: 0 % (ref 0–5)
Lymphocytes Relative: 12 % (ref 12–46)
Lymphs Abs: 0.7 10*3/uL (ref 0.7–4.0)
Monocytes Absolute: 0.4 10*3/uL (ref 0.1–1.0)
Monocytes Relative: 7 % (ref 3–12)
Neutro Abs: 6.8 10*3/uL (ref 1.7–7.7)
Neutro Abs: 7.2 10*3/uL (ref 1.7–7.7)
Neutrophils Relative %: 81 % — ABNORMAL HIGH (ref 43–77)
Neutrophils Relative %: 86 % — ABNORMAL HIGH (ref 43–77)

## 2010-12-23 LAB — CARDIAC PANEL(CRET KIN+CKTOT+MB+TROPI)
CK, MB: 5.7 ng/mL — ABNORMAL HIGH (ref 0.3–4.0)
Total CK: 67 U/L (ref 7–177)
Troponin I: 0.06 ng/mL (ref 0.00–0.06)
Troponin I: 0.09 ng/mL — ABNORMAL HIGH (ref 0.00–0.06)

## 2010-12-23 LAB — COMPREHENSIVE METABOLIC PANEL
ALT: 19 U/L (ref 0–35)
AST: 21 U/L (ref 0–37)
Alkaline Phosphatase: 48 U/L (ref 39–117)
Calcium: 9.6 mg/dL (ref 8.4–10.5)
GFR calc Af Amer: 20 mL/min — ABNORMAL LOW (ref >60)
Glucose, Bld: 144 mg/dL — ABNORMAL HIGH (ref 70–99)
Potassium: 3.7 mEq/L (ref 3.5–5.1)
Sodium: 142 mEq/L (ref 135–145)
Total Protein: 6.3 g/dL (ref 6.0–8.3)

## 2010-12-23 LAB — FERRITIN: Ferritin: 527 ng/mL — ABNORMAL HIGH (ref 10–291)

## 2010-12-23 LAB — MRSA PCR SCREENING: MRSA by PCR: NEGATIVE

## 2010-12-23 LAB — URINE CULTURE
Colony Count: NO GROWTH
Culture  Setup Time: 201109181844
Culture: NO GROWTH

## 2010-12-23 LAB — URINALYSIS, ROUTINE W REFLEX MICROSCOPIC
Bilirubin Urine: NEGATIVE
Hgb urine dipstick: NEGATIVE
Ketones, ur: NEGATIVE mg/dL
Protein, ur: 100 mg/dL — AB
Urobilinogen, UA: 0.2 mg/dL (ref 0.0–1.0)

## 2010-12-23 LAB — BRAIN NATRIURETIC PEPTIDE
Pro B Natriuretic peptide (BNP): 1170 pg/mL — ABNORMAL HIGH (ref 0.0–100.0)
Pro B Natriuretic peptide (BNP): 403 pg/mL — ABNORMAL HIGH (ref 0.0–100.0)
Pro B Natriuretic peptide (BNP): 419 pg/mL — ABNORMAL HIGH (ref 0.0–100.0)
Pro B Natriuretic peptide (BNP): 895 pg/mL — ABNORMAL HIGH (ref 0.0–100.0)

## 2010-12-23 LAB — TSH: TSH: 3.465 u[IU]/mL (ref 0.350–4.500)

## 2010-12-23 LAB — CK TOTAL AND CKMB (NOT AT ARMC)
CK, MB: 4.4 ng/mL — ABNORMAL HIGH (ref 0.3–4.0)
Relative Index: INVALID (ref 0.0–2.5)
Total CK: 53 U/L (ref 7–177)

## 2010-12-23 LAB — FOLATE RBC: RBC Folate: 1655 ng/mL — ABNORMAL HIGH (ref 180–600)

## 2010-12-23 LAB — D-DIMER, QUANTITATIVE: D-Dimer, Quant: 3.07 ug/mL-FEU — ABNORMAL HIGH (ref 0.00–0.48)

## 2010-12-25 LAB — CBC
HCT: 35.9 % — ABNORMAL LOW (ref 36.0–46.0)
Platelets: 157 10*3/uL (ref 150–400)
RDW: 15.5 % (ref 11.5–15.5)
WBC: 7.3 10*3/uL (ref 4.0–10.5)

## 2010-12-25 LAB — SURGICAL PCR SCREEN: MRSA, PCR: NEGATIVE

## 2010-12-25 LAB — PROTIME-INR: Prothrombin Time: 14.3 seconds (ref 11.6–15.2)

## 2010-12-25 LAB — IRON AND TIBC
Iron: 67 ug/dL (ref 42–135)
TIBC: 262 ug/dL (ref 250–470)

## 2010-12-26 LAB — POCT HEMOGLOBIN-HEMACUE
Hemoglobin: 11.9 g/dL — ABNORMAL LOW (ref 12.0–15.0)
Hemoglobin: 12.9 g/dL (ref 12.0–15.0)

## 2010-12-26 LAB — FERRITIN: Ferritin: 400 ng/mL — ABNORMAL HIGH (ref 10–291)

## 2010-12-26 LAB — IRON AND TIBC: UIBC: 165 ug/dL

## 2010-12-28 LAB — RENAL FUNCTION PANEL
Albumin: 3.5 g/dL (ref 3.5–5.2)
Calcium: 9.8 mg/dL (ref 8.4–10.5)
GFR calc Af Amer: 28 mL/min — ABNORMAL LOW (ref >60)
Phosphorus: 4 mg/dL (ref 2.3–4.6)
Potassium: 3.5 mEq/L (ref 3.5–5.1)
Sodium: 139 mEq/L (ref 135–145)

## 2010-12-28 LAB — POCT HEMOGLOBIN-HEMACUE
Hemoglobin: 11.5 g/dL — ABNORMAL LOW (ref 12.0–15.0)
Hemoglobin: 11.2 g/dL — ABNORMAL LOW (ref 12.0–15.0)

## 2010-12-28 LAB — PTH, INTACT AND CALCIUM
Calcium, Total (PTH): 9.7 mg/dL (ref 8.4–10.5)
PTH: 108.8 pg/mL — ABNORMAL HIGH (ref 14.0–72.0)

## 2010-12-29 LAB — POCT HEMOGLOBIN-HEMACUE: Hemoglobin: 11.9 g/dL — ABNORMAL LOW (ref 12.0–15.0)

## 2010-12-31 LAB — FERRITIN
Ferritin: 237 ng/mL (ref 10–291)
Ferritin: 263 ng/mL (ref 10–291)

## 2010-12-31 LAB — IRON AND TIBC
Iron: <10 ug/dL — ABNORMAL LOW (ref 42–135)
TIBC: 309 ug/dL (ref 250–470)
UIBC: 202 ug/dL
UIBC: 218 ug/dL

## 2010-12-31 LAB — PTH, INTACT AND CALCIUM: PTH: 154.1 pg/mL — ABNORMAL HIGH (ref 14.0–72.0)

## 2011-01-11 LAB — PTH, INTACT AND CALCIUM
Calcium, Total (PTH): 9.6 mg/dL (ref 8.4–10.5)
PTH: 148.4 pg/mL — ABNORMAL HIGH (ref 14.0–72.0)

## 2011-01-12 LAB — FERRITIN: Ferritin: 280 ng/mL (ref 10–291)

## 2011-01-12 LAB — POCT HEMOGLOBIN-HEMACUE: Hemoglobin: 10.5 g/dL — ABNORMAL LOW (ref 12.0–15.0)

## 2011-01-12 LAB — IRON AND TIBC
Saturation Ratios: 30 % (ref 20–55)
TIBC: 276 ug/dL (ref 250–470)

## 2011-01-13 LAB — DIFFERENTIAL
Basophils Absolute: 0 10*3/uL (ref 0.0–0.1)
Eosinophils Relative: 6 % — ABNORMAL HIGH (ref 0–5)
Lymphocytes Relative: 25 % (ref 12–46)
Lymphs Abs: 2.1 10*3/uL (ref 0.7–4.0)
Neutro Abs: 4.8 10*3/uL (ref 1.7–7.7)

## 2011-01-13 LAB — POCT HEMOGLOBIN-HEMACUE
Hemoglobin: 10 g/dL — ABNORMAL LOW (ref 12.0–15.0)
Hemoglobin: 12.2 g/dL (ref 12.0–15.0)

## 2011-01-13 LAB — IRON AND TIBC
Iron: 91 ug/dL (ref 42–135)
Saturation Ratios: 34 % (ref 20–55)
TIBC: 268 ug/dL (ref 250–470)

## 2011-01-13 LAB — CBC
HCT: 34.5 % — ABNORMAL LOW (ref 36.0–46.0)
Platelets: 169 10*3/uL (ref 150–400)
RDW: 15.1 % (ref 11.5–15.5)
WBC: 8.2 10*3/uL (ref 4.0–10.5)

## 2011-01-14 LAB — POCT HEMOGLOBIN-HEMACUE: Hemoglobin: 13.3 g/dL (ref 12.0–15.0)

## 2011-01-15 LAB — POCT HEMOGLOBIN-HEMACUE: Hemoglobin: 11.4 g/dL — ABNORMAL LOW (ref 12.0–15.0)

## 2011-01-15 LAB — IRON AND TIBC
Iron: 84 ug/dL (ref 42–135)
Saturation Ratios: 31 % (ref 20–55)
TIBC: 267 ug/dL (ref 250–470)
UIBC: 183 ug/dL

## 2011-01-16 LAB — FERRITIN: Ferritin: 401 ng/mL — ABNORMAL HIGH (ref 10–291)

## 2011-01-16 LAB — IRON AND TIBC: TIBC: 281 ug/dL (ref 250–470)

## 2011-01-17 LAB — IRON AND TIBC
Iron: 51 ug/dL (ref 42–135)
Saturation Ratios: 17 % — ABNORMAL LOW (ref 20–55)
TIBC: 295 ug/dL (ref 250–470)
UIBC: 244 ug/dL

## 2011-01-17 LAB — FERRITIN: Ferritin: 293 ng/mL — ABNORMAL HIGH (ref 10–291)

## 2011-01-18 LAB — POCT HEMOGLOBIN-HEMACUE: Hemoglobin: 10.5 g/dL — ABNORMAL LOW (ref 12.0–15.0)

## 2011-01-19 LAB — IRON AND TIBC
Iron: 115 ug/dL (ref 42–135)
TIBC: 308 ug/dL (ref 250–470)
UIBC: 193 ug/dL

## 2011-01-19 LAB — POCT HEMOGLOBIN-HEMACUE: Hemoglobin: 11.7 g/dL — ABNORMAL LOW (ref 12.0–15.0)

## 2011-01-24 LAB — IRON AND TIBC
Iron: 95 ug/dL (ref 42–135)
Saturation Ratios: 32 % (ref 20–55)
TIBC: 296 ug/dL (ref 250–470)

## 2011-01-24 LAB — POCT HEMOGLOBIN-HEMACUE: Hemoglobin: 11.8 g/dL — ABNORMAL LOW (ref 12.0–15.0)

## 2011-01-25 LAB — IRON AND TIBC: Iron: 107 ug/dL (ref 42–135)

## 2011-01-25 LAB — POCT HEMOGLOBIN-HEMACUE: Hemoglobin: 11.6 g/dL — ABNORMAL LOW (ref 12.0–15.0)

## 2011-02-19 IMAGING — CT CT ABCESS DRAINAGE
1 of 5 series · 14 of 32 positions shown, 19 images · non-contrast
Comparison: none

Clinical Data/Indication: PELVIC ABSCESS

CT GUIDED ABCESS DRAINAGE WITH CATHETER
Sedation: Versed zero mg, Fentanyl 25 mg.
Total Moderate Sedation Time: 15 minutes.
Procedure: The procedure, risks, benefits, and alternatives were
explained to the patient. Questions regarding the procedure were
encouraged and answered. The patient understands and consents to
the procedure.
The right gluteal region in the prone position was prepped with
betadine in a sterile fashion, and a sterile drape was applied
covering the operative field. A sterile gown and sterile gloves
were used for the procedure.
Under CT guidance, a yearly angiocath was inserted into the pelvic
abscess via right transgluteal approach.  It was removed over an
Amplatz.  12-French drain was inserted, string fixed, and sewn to
the skin.  60 ml pus was aspirated.

[Series 2: rtn pelvis st · axial · 0.74mm/px · z∈[-192,-52]mm · 14 of 32 slices shown, 19 images]
[im 2/32  soft-tissue]
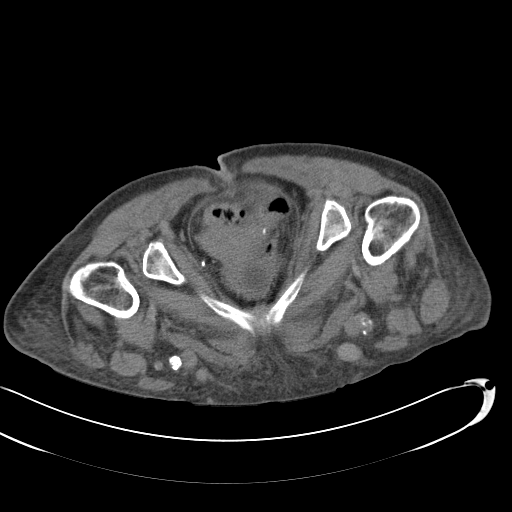
[im 2/32  bone]
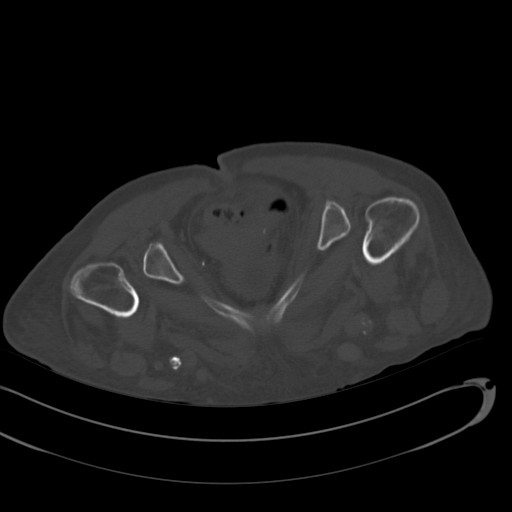
[im 4/32  soft-tissue]
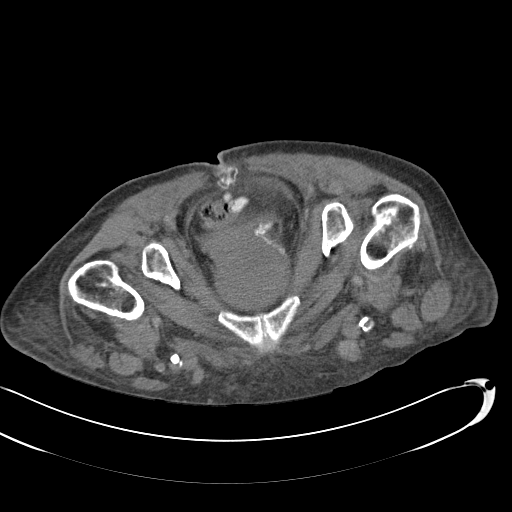
[im 6/32  soft-tissue]
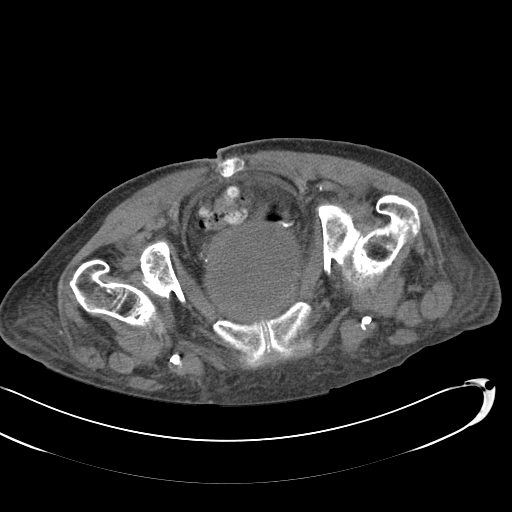
[im 10/32  soft-tissue]
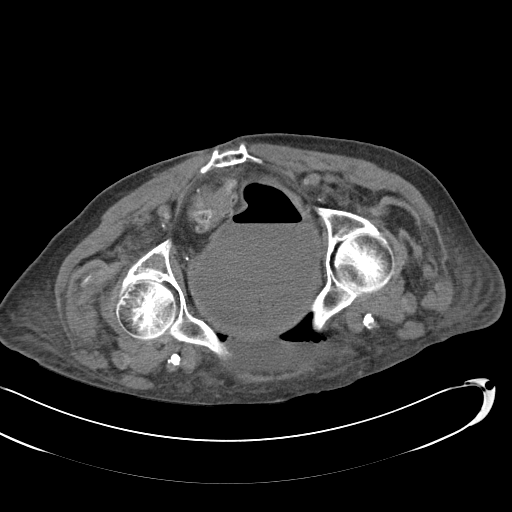
[im 12/32  soft-tissue]
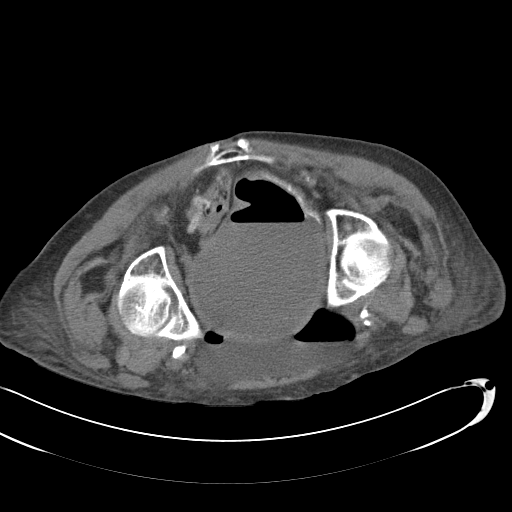
[im 14/32  soft-tissue]
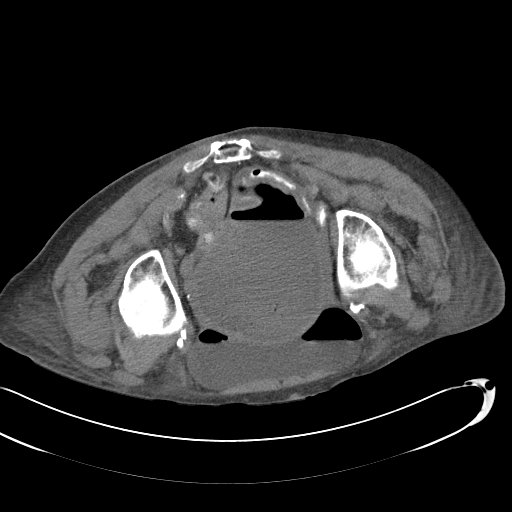
[im 16/32  soft-tissue]
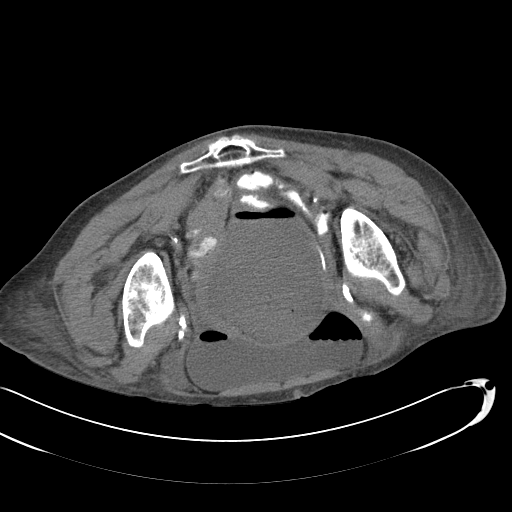
[im 18/32  soft-tissue]
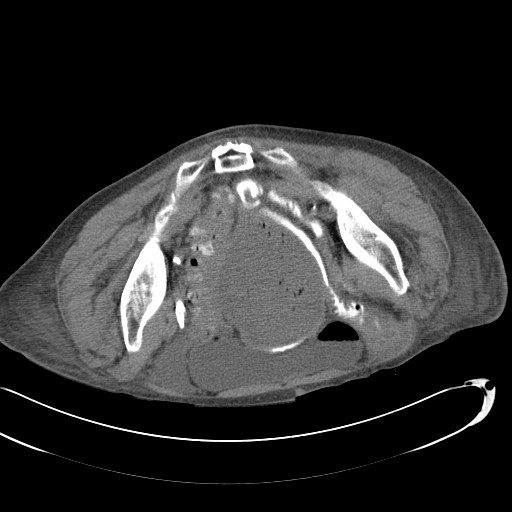
[im 20/32  soft-tissue]
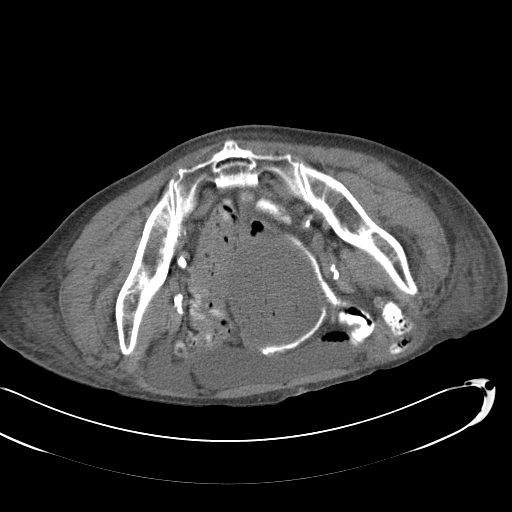
[im 20/32  bone]
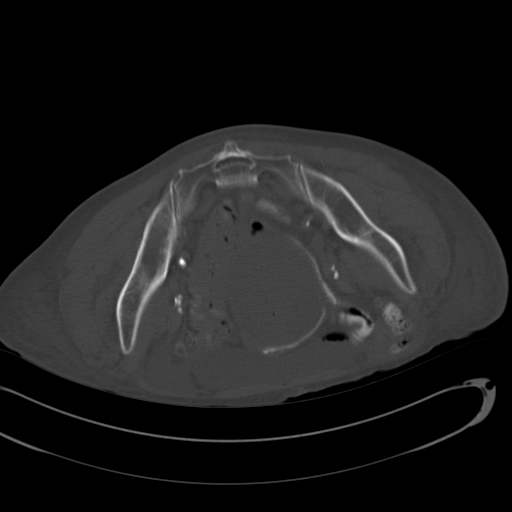
[im 22/32  soft-tissue]
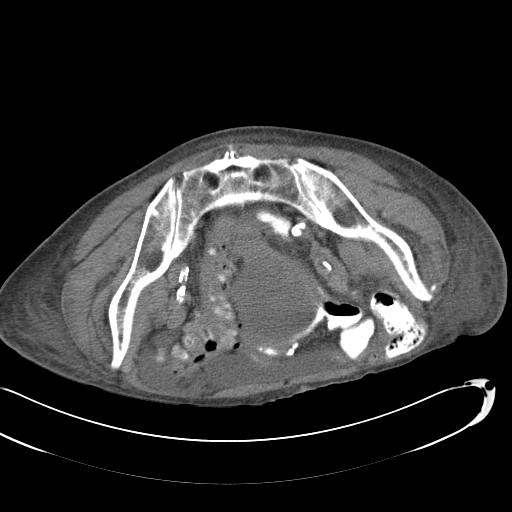
[im 24/32  lung]
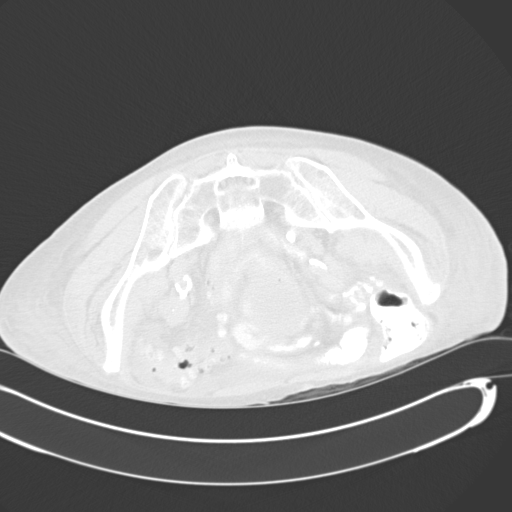
[im 26/32  soft-tissue]
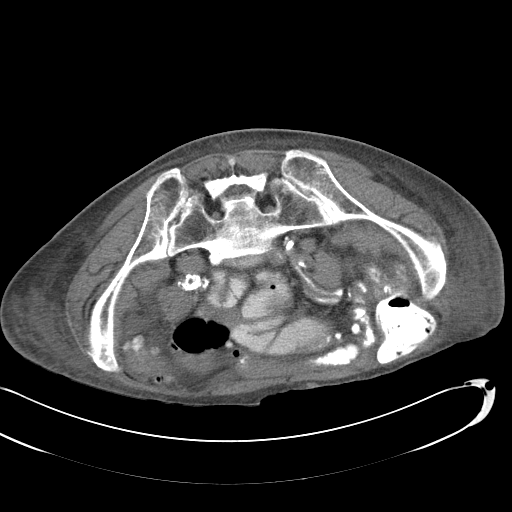
[im 26/32  lung]
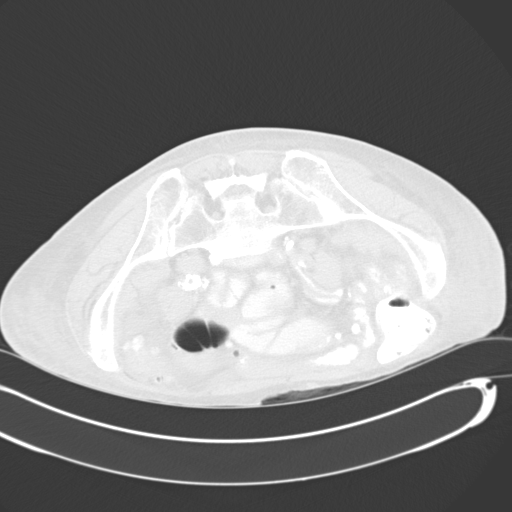
[im 28/32  soft-tissue]
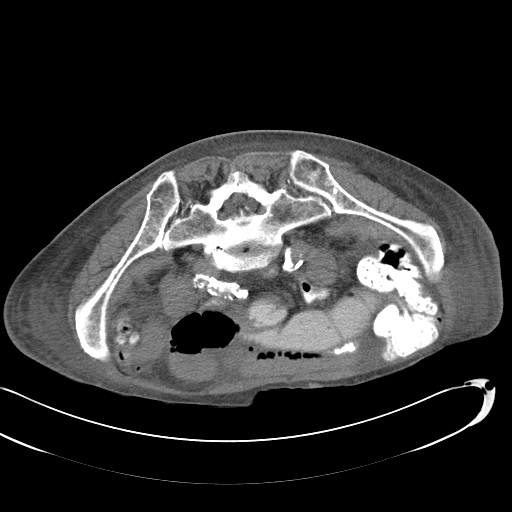
[im 28/32  lung]
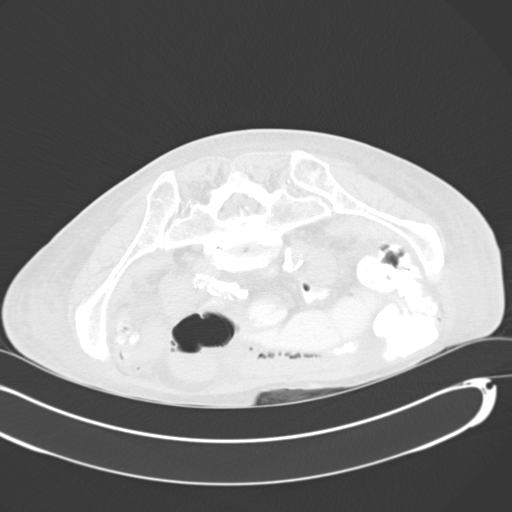
[im 30/32  soft-tissue]
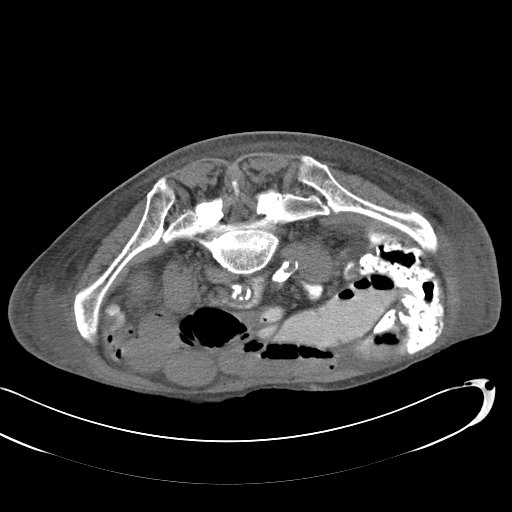
[im 30/32  lung]
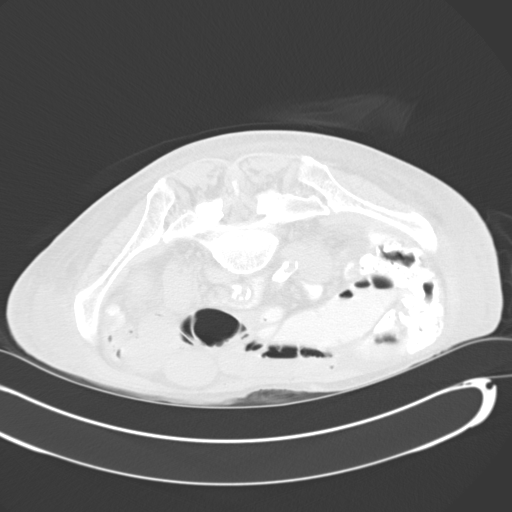

[14 of 32 positions shown; findings below may reference images not displayed]

FINDINGS: Imaging demonstrates placement of a 12-French right
transgluteal pelvic abscess drain.

Complications: None.
IMPRESSION: Successful right transgluteal pelvic abscess drain.

## 2011-02-22 NOTE — Consult Note (Signed)
NEW PATIENT CONSULTATION   Judith Leach, Judith Leach  DOB:  [birth year]                                       10/28/2008  ZOXWR#:[id number]   The patient is an 77 year old female referred by Dr. Darrick Penna for  vascular disease in the lower extremities, both arterial and venous.  She has had some cellulitis bothering her in the left leg over the last  few months which has been treated twice by [facility]  and also has [facility] [place].  She had an Unna  boot applied because of an ulceration on the left pretibial area, which  apparently was caused by trauma she states.  She has a long history of  large bulging varicosities in both legs with darkening of the skin and  scaliness and edema distally in the legs.  She has no history of deep  venous thrombosis or superficial thrombophlebitis.  She has some mild  claudication symptoms in the calf but is able to ambulate up to 1 block.  She has not worn elastic compression stockings or tried elevation of the  legs on a regular basis or pain medication.   PAST MEDICAL HISTORY:  1. Chronic renal insufficiency.  2. Hypertension.  3. Degenerative joint disease.  4. Chronic urinary tract infectious.  5. Diverticular disease in the colon.  6. Peptic ulcer disease.  7. Secondary hyperparathyroidism.  8. Negative for coronary artery disease, COPD or diabetes.   PREVIOUS SURGERY:  Colon resection for diverticulitis.   FAMILY HISTORY:  Positive for coronary artery disease in her mother and  father and brother, stroke in her mother, negative for diabetes.   SOCIAL HISTORY:  She is married, is retired.  She smoked a pack  cigarettes per day prior to 1984, at which time she quit.  No alcohol  use.   REVIEW OF SYSTEMS:  Unremarkable.   ALLERGIES:  None known.   MEDICATIONS:  Please see health history form.   PHYSICAL EXAM:  Vital Signs:  Blood pressure is 150/84, heart rate 72,  respirations 14.   General:  She is an elderly female in no apparent  stress, alert and oriented x3.  Neck:  Supple, 3+ carotid pulses  palpable.  No bruits are audible.  Neurologic:  Normal.  No palpable  adenopathy in the neck.  Chest:  Clear to auscultation.  Cardiovascular:  Regular rhythm, no murmur.  Abdomen:  Soft, nontender with no palpable  masses.  Extremities:  She has 3+ femoral and 2+ popliteal pulses  bilaterally with no distal pulses palpable.  Both legs have severe  venous insufficiency with left leg having bulging varicosities along the  great saphenous system from the mid thigh down to and involving the  entire calf medially with severe hyperpigmentation and  dermatoliposclerosis with no active ulceration and 1+ edema.  Right leg  has similar situation with bulging varicosities primarily below the knee  in the greater saphenous system down to the ankle with very thin  hyperpigmented skin.   Venous duplex exam in our office reveals her arterial system to have  some tibial disease, as one would expect, with biphasic flow distally  and ABIs of about 0.64 on the left and 0.67 on the right.  She also has  severe venous insufficiency with gross reflux and valvular incompetence  throughout both great  saphenous systems causing her severe varicosities  and skin changes distally.  She also has some deep venous insufficiency  on the left but no chronic obstruction.   I think her symptomatology is primarily coming from her venous disease  and because of multiple episodes of cellulitis as well as the need for  an Unna boot to heal an ulcer in the left leg and hyperpigmentation I  would recommend treating her conservatively for 3 months and then  considering laser ablation of her great saphenous veins as office-based  procedures to rid her of the severe venous insufficiency and stabilize  the skin.  Does not have an active ulcer at the present time but has had  one requiring an Unna boot as well as  has required antibiotics for  cellulitis.  She will be discussing this further with Dr. Darrick Penna in  the near future, and if she would like to proceed with this treatment  plan she will be back in touch with [place] to measure her for elastic  compression stockings to initiate the 17-month treatment plan.   Quita Skye Hart [place], M.D.  Electronically Signed   JDL/MEDQ  D:  10/28/2008  T:  10/29/2008  Job:  [id number]   cc:   Fayrene Fearing L. Deterding, M.D.

## 2011-02-22 NOTE — Procedures (Signed)
LOWER EXTREMITY VENOUS REFLUX EXAM   INDICATION:  Bilateral lower extremity varicose veins.   EXAM:  Using color-flow imaging and pulse Doppler spectral analysis, the  right and left common femoral, superficial femoral, popliteal, posterior  tibial, greater and lesser saphenous veins are evaluated.  There is  evidence suggesting deep venous insufficiency in the left lower  extremity.   The right and left saphenofemoral junctions are not competent.  The  right and left GSV's are not competent with the caliber as described  below.   The right and left proximal short saphenous veins demonstrate  competency.   GSV Diameter (used if found to be incompetent only)                                            Right    Left  Proximal Greater Saphenous Vein           0.89 cm  0.55 cm  Proximal-to-mid-thigh                     0.75 cm  0.53 cm  Mid thigh                                 0.79 cm  0.51 cm  Mid-distal thigh                          0.83 cm  0.27 cm  Distal thigh                              0.67 cm  0.24 cm  Knee                                      0.77 cm  0.95 cm   IMPRESSION:  1. Right and left greater saphenous vein reflux is identified with the      caliber ranging from 0.89 cm to 0.67 cm knee to groin on the right      and 0.95 cm to 0.27 cm, knee to groin on the left.  2. The right and left greater saphenous veins are not aneurysmal.  3. The right and left greater saphenous veins are not tortuous.  4. The deep venous system is not competent on the left.  5. The right and left lesser saphenous veins are not competent.  6. The left greater saphenous vein has a large incompetent tributary      which arises at the mid thigh and reconnects to the greater      saphenous vein at the proximal calf.  The greater saphenous vein      between these 2 points is considerably smaller than the rest of the      vessel.      ___________________________________________  Quita Skye. Hart [place], M.D.   MC/MEDQ  D:  10/28/2008  T:  10/28/2008  Job:  [id number]

## 2011-03-17 ENCOUNTER — Encounter: Payer: Self-pay | Admitting: Internal Medicine

## 2011-04-22 ENCOUNTER — Encounter: Payer: Self-pay | Admitting: Internal Medicine

## 2011-04-27 ENCOUNTER — Encounter: Payer: Self-pay | Admitting: Internal Medicine

## 2011-06-29 LAB — CBC
Hemoglobin: 12.1
MCHC: 33.2
RBC: 3.84 — ABNORMAL LOW

## 2011-06-29 LAB — IRON AND TIBC
Iron: 101
Saturation Ratios: 35
UIBC: 191

## 2011-07-01 LAB — CBC
Hemoglobin: 12.8
MCHC: 33.6
MCV: 94.2
RBC: 4.03
RDW: 14.8

## 2011-07-01 LAB — FERRITIN: Ferritin: 250 (ref 10–291)

## 2011-07-01 LAB — IRON AND TIBC: Iron: 101

## 2011-07-04 LAB — CBC
HCT: 33 — ABNORMAL LOW
Hemoglobin: 11.1 — ABNORMAL LOW
MCV: 92.4
WBC: 8

## 2011-07-04 LAB — IRON AND TIBC
Saturation Ratios: 35
UIBC: 187

## 2011-07-05 LAB — FERRITIN: Ferritin: 355 — ABNORMAL HIGH (ref 10–291)

## 2011-07-05 LAB — CBC
Hemoglobin: 11.1 — ABNORMAL LOW
RBC: 3.47 — ABNORMAL LOW
WBC: 8.6

## 2011-07-05 LAB — IRON AND TIBC
Iron: 106
Saturation Ratios: 38
TIBC: 282
UIBC: 176

## 2011-07-06 LAB — CBC
Platelets: 196
RDW: 15.6 — ABNORMAL HIGH

## 2011-07-06 LAB — IRON AND TIBC
Iron: 81
Saturation Ratios: 31
TIBC: 264

## 2011-07-07 LAB — CBC
HCT: 33.1 — ABNORMAL LOW
MCHC: 34.4
MCHC: 34.1
MCV: 95.4
MCV: 95.1
Platelets: 212
Platelets: 190
RBC: 3.37 — ABNORMAL LOW
RDW: 14.1
WBC: 7.4

## 2011-07-07 LAB — FERRITIN: Ferritin: 295 — ABNORMAL HIGH (ref 10–291)

## 2011-07-07 LAB — IRON AND TIBC
Iron: 71
UIBC: 199

## 2011-07-08 LAB — CBC
Hemoglobin: 11.2 — ABNORMAL LOW
MCHC: 33.3
RBC: 3.53 — ABNORMAL LOW

## 2011-07-08 LAB — FERRITIN: Ferritin: 327 — ABNORMAL HIGH (ref 10–291)

## 2011-07-08 LAB — IRON AND TIBC
Saturation Ratios: 32
TIBC: 262

## 2011-07-12 LAB — CBC
HCT: 35 % — ABNORMAL LOW (ref 36.0–46.0)
Hemoglobin: 11.7 g/dL — ABNORMAL LOW (ref 12.0–15.0)
MCHC: 33.5 g/dL (ref 30.0–36.0)
RDW: 15.6 % — ABNORMAL HIGH (ref 11.5–15.5)

## 2011-07-12 LAB — POCT HEMOGLOBIN-HEMACUE
Hemoglobin: 10.1 g/dL — ABNORMAL LOW (ref 12.0–15.0)
Hemoglobin: 10.7 g/dL — ABNORMAL LOW (ref 12.0–15.0)

## 2011-07-12 LAB — IRON AND TIBC
Iron: 96 ug/dL (ref 42–135)
Saturation Ratios: 33 % (ref 20–55)
TIBC: 292 ug/dL (ref 250–470)
UIBC: 196 ug/dL

## 2011-07-12 LAB — POCT I-STAT 4, (NA,K, GLUC, HGB,HCT): Sodium: 135 mEq/L (ref 135–145)

## 2011-07-13 LAB — CBC
MCHC: 33.5
MCV: 95.3
RBC: 3.54 — ABNORMAL LOW
RDW: 15.3

## 2011-07-13 LAB — IRON AND TIBC
Iron: 86
TIBC: 283

## 2011-07-13 LAB — FERRITIN: Ferritin: 339 — ABNORMAL HIGH (ref 10–291)

## 2011-07-14 LAB — IRON AND TIBC
Iron: 83 ug/dL (ref 42–135)
Saturation Ratios: 34 % (ref 20–55)
TIBC: 284 ug/dL (ref 250–470)
UIBC: 183 ug/dL

## 2011-07-14 LAB — FERRITIN: Ferritin: 217 ng/mL (ref 10–291)

## 2011-07-14 LAB — POCT HEMOGLOBIN-HEMACUE: Hemoglobin: 11.9 g/dL — ABNORMAL LOW (ref 12.0–15.0)

## 2011-07-18 LAB — CBC
HCT: 36
Hemoglobin: 12.1
MCV: 94.9
RDW: 15.4
WBC: 6.4

## 2011-07-18 LAB — IRON AND TIBC
Saturation Ratios: 36
UIBC: 188

## 2011-07-19 LAB — IRON AND TIBC
Iron: 97
TIBC: 282
UIBC: 185

## 2011-07-19 LAB — CBC
Hemoglobin: 11.6 — ABNORMAL LOW
RBC: 3.65 — ABNORMAL LOW
WBC: 6.4

## 2011-07-19 LAB — FERRITIN: Ferritin: 293 — ABNORMAL HIGH (ref 10–291)

## 2011-07-20 LAB — IRON AND TIBC
Iron: 92
Saturation Ratios: 32
TIBC: 288

## 2011-07-20 LAB — CBC
Platelets: 210
RDW: 14.5 — ABNORMAL HIGH
WBC: 8.1

## 2011-07-22 LAB — CBC
MCHC: 33.6
MCV: 95.3
Platelets: 195
RDW: 14

## 2011-07-25 LAB — CBC
HCT: 32.1 — ABNORMAL LOW
MCV: 94.8
Platelets: 176
Platelets: 193
RBC: 3.4 — ABNORMAL LOW
WBC: 7.2
WBC: 7.6

## 2011-07-27 LAB — CBC
HCT: 30.1 — ABNORMAL LOW
Hemoglobin: 10.2 — ABNORMAL LOW
MCV: 93
RDW: 14.4 — ABNORMAL HIGH

## 2011-07-27 LAB — IRON AND TIBC: Iron: 98
